# Patient Record
Sex: Female | Born: 1960 | Race: White | Hispanic: No | Marital: Single | Smoking: Never smoker
Health system: Southern US, Community
[De-identification: ages and names within clinical notes are randomized; demographics above are authoritative.]

---

## 1998-06-27 ENCOUNTER — Other Ambulatory Visit: Admission: RE | Admit: 1998-06-27 | Discharge: 1998-06-27 | Payer: Self-pay | Admitting: Obstetrics & Gynecology

## 1999-09-21 ENCOUNTER — Other Ambulatory Visit: Admission: RE | Admit: 1999-09-21 | Discharge: 1999-09-21 | Payer: Self-pay | Admitting: Obstetrics & Gynecology

## 2003-01-29 ENCOUNTER — Other Ambulatory Visit: Admission: RE | Admit: 2003-01-29 | Discharge: 2003-01-29 | Payer: Self-pay | Admitting: Obstetrics & Gynecology

## 2003-04-21 ENCOUNTER — Other Ambulatory Visit: Admission: RE | Admit: 2003-04-21 | Discharge: 2003-04-21 | Payer: Self-pay | Admitting: Obstetrics & Gynecology

## 2003-08-18 ENCOUNTER — Other Ambulatory Visit: Admission: RE | Admit: 2003-08-18 | Discharge: 2003-08-18 | Payer: Self-pay | Admitting: Obstetrics & Gynecology

## 2004-03-15 ENCOUNTER — Other Ambulatory Visit: Admission: RE | Admit: 2004-03-15 | Discharge: 2004-03-15 | Payer: Self-pay | Admitting: Obstetrics & Gynecology

## 2005-03-28 ENCOUNTER — Other Ambulatory Visit: Admission: RE | Admit: 2005-03-28 | Discharge: 2005-03-28 | Payer: Self-pay | Admitting: Obstetrics & Gynecology

## 2015-08-25 DIAGNOSIS — H16223 Keratoconjunctivitis sicca, not specified as Sjogren's, bilateral: Secondary | ICD-10-CM | POA: Diagnosis not present

## 2015-11-03 DIAGNOSIS — H16223 Keratoconjunctivitis sicca, not specified as Sjogren's, bilateral: Secondary | ICD-10-CM | POA: Diagnosis not present

## 2015-12-22 ENCOUNTER — Ambulatory Visit
Admission: RE | Admit: 2015-12-22 | Discharge: 2015-12-22 | Disposition: A | Discharge status: Home / Self Care | Payer: BLUE CROSS/BLUE SHIELD | Source: Ambulatory Visit | Attending: Physician Assistant | Admitting: Physician Assistant

## 2015-12-22 ENCOUNTER — Other Ambulatory Visit: Payer: Self-pay | Admitting: Physician Assistant

## 2015-12-22 DIAGNOSIS — M25571 Pain in right ankle and joints of right foot: Principal | ICD-10-CM

## 2015-12-22 DIAGNOSIS — F419 Anxiety disorder, unspecified: Secondary | ICD-10-CM | POA: Diagnosis not present

## 2015-12-22 DIAGNOSIS — M79671 Pain in right foot: Secondary | ICD-10-CM | POA: Diagnosis not present

## 2015-12-22 DIAGNOSIS — Z1389 Encounter for screening for other disorder: Secondary | ICD-10-CM | POA: Diagnosis not present

## 2015-12-22 DIAGNOSIS — G43009 Migraine without aura, not intractable, without status migrainosus: Secondary | ICD-10-CM | POA: Diagnosis not present

## 2015-12-22 DIAGNOSIS — G8929 Other chronic pain: Secondary | ICD-10-CM

## 2015-12-22 DIAGNOSIS — E669 Obesity, unspecified: Secondary | ICD-10-CM | POA: Diagnosis not present

## 2015-12-22 DIAGNOSIS — M19071 Primary osteoarthritis, right ankle and foot: Secondary | ICD-10-CM | POA: Diagnosis not present

## 2015-12-22 DIAGNOSIS — R7303 Prediabetes: Secondary | ICD-10-CM | POA: Diagnosis not present

## 2015-12-22 DIAGNOSIS — E7800 Pure hypercholesterolemia, unspecified: Secondary | ICD-10-CM | POA: Diagnosis not present

## 2015-12-22 DIAGNOSIS — S99921A Unspecified injury of right foot, initial encounter: Secondary | ICD-10-CM | POA: Diagnosis not present

## 2015-12-22 DIAGNOSIS — R609 Edema, unspecified: Secondary | ICD-10-CM | POA: Diagnosis not present

## 2016-06-26 DIAGNOSIS — F419 Anxiety disorder, unspecified: Secondary | ICD-10-CM | POA: Diagnosis not present

## 2016-06-26 DIAGNOSIS — Z23 Encounter for immunization: Secondary | ICD-10-CM | POA: Diagnosis not present

## 2016-06-26 DIAGNOSIS — R7303 Prediabetes: Secondary | ICD-10-CM | POA: Diagnosis not present

## 2016-06-26 DIAGNOSIS — Z79899 Other long term (current) drug therapy: Secondary | ICD-10-CM | POA: Diagnosis not present

## 2016-06-26 DIAGNOSIS — E7800 Pure hypercholesterolemia, unspecified: Secondary | ICD-10-CM | POA: Diagnosis not present

## 2016-06-26 DIAGNOSIS — R609 Edema, unspecified: Secondary | ICD-10-CM | POA: Diagnosis not present

## 2016-08-20 DIAGNOSIS — Z1231 Encounter for screening mammogram for malignant neoplasm of breast: Secondary | ICD-10-CM | POA: Diagnosis not present

## 2016-08-20 DIAGNOSIS — Z01419 Encounter for gynecological examination (general) (routine) without abnormal findings: Secondary | ICD-10-CM | POA: Diagnosis not present

## 2016-08-20 DIAGNOSIS — Z124 Encounter for screening for malignant neoplasm of cervix: Secondary | ICD-10-CM | POA: Diagnosis not present

## 2016-08-20 DIAGNOSIS — Z6835 Body mass index (BMI) 35.0-35.9, adult: Secondary | ICD-10-CM | POA: Diagnosis not present

## 2016-08-24 DIAGNOSIS — J320 Chronic maxillary sinusitis: Secondary | ICD-10-CM | POA: Diagnosis not present

## 2016-09-05 DIAGNOSIS — Z1211 Encounter for screening for malignant neoplasm of colon: Secondary | ICD-10-CM | POA: Diagnosis not present

## 2016-12-14 DIAGNOSIS — J320 Chronic maxillary sinusitis: Secondary | ICD-10-CM | POA: Diagnosis not present

## 2016-12-19 DIAGNOSIS — K1379 Other lesions of oral mucosa: Secondary | ICD-10-CM | POA: Diagnosis not present

## 2016-12-31 DIAGNOSIS — F419 Anxiety disorder, unspecified: Secondary | ICD-10-CM | POA: Diagnosis not present

## 2016-12-31 DIAGNOSIS — E7800 Pure hypercholesterolemia, unspecified: Secondary | ICD-10-CM | POA: Diagnosis not present

## 2016-12-31 DIAGNOSIS — R7303 Prediabetes: Secondary | ICD-10-CM | POA: Diagnosis not present

## 2016-12-31 DIAGNOSIS — R609 Edema, unspecified: Secondary | ICD-10-CM | POA: Diagnosis not present

## 2017-02-19 DIAGNOSIS — D2239 Melanocytic nevi of other parts of face: Secondary | ICD-10-CM | POA: Diagnosis not present

## 2017-02-19 DIAGNOSIS — L814 Other melanin hyperpigmentation: Secondary | ICD-10-CM | POA: Diagnosis not present

## 2017-04-25 DIAGNOSIS — Z23 Encounter for immunization: Secondary | ICD-10-CM | POA: Diagnosis not present

## 2017-07-05 DIAGNOSIS — F419 Anxiety disorder, unspecified: Secondary | ICD-10-CM | POA: Diagnosis not present

## 2017-07-05 DIAGNOSIS — E7800 Pure hypercholesterolemia, unspecified: Secondary | ICD-10-CM | POA: Diagnosis not present

## 2017-07-05 DIAGNOSIS — Z1331 Encounter for screening for depression: Secondary | ICD-10-CM | POA: Diagnosis not present

## 2017-07-05 DIAGNOSIS — R609 Edema, unspecified: Secondary | ICD-10-CM | POA: Diagnosis not present

## 2017-07-05 DIAGNOSIS — G43009 Migraine without aura, not intractable, without status migrainosus: Secondary | ICD-10-CM | POA: Diagnosis not present

## 2017-09-10 DIAGNOSIS — Z6835 Body mass index (BMI) 35.0-35.9, adult: Secondary | ICD-10-CM | POA: Diagnosis not present

## 2017-09-10 DIAGNOSIS — Z1231 Encounter for screening mammogram for malignant neoplasm of breast: Secondary | ICD-10-CM | POA: Diagnosis not present

## 2017-09-10 DIAGNOSIS — Z01419 Encounter for gynecological examination (general) (routine) without abnormal findings: Secondary | ICD-10-CM | POA: Diagnosis not present

## 2017-10-07 DIAGNOSIS — Z1211 Encounter for screening for malignant neoplasm of colon: Secondary | ICD-10-CM | POA: Diagnosis not present

## 2017-12-13 ENCOUNTER — Encounter: Payer: Self-pay | Admitting: Sports Medicine

## 2017-12-13 ENCOUNTER — Other Ambulatory Visit: Payer: Self-pay | Admitting: Sports Medicine

## 2017-12-13 ENCOUNTER — Ambulatory Visit (INDEPENDENT_AMBULATORY_CARE_PROVIDER_SITE_OTHER): Payer: BLUE CROSS/BLUE SHIELD

## 2017-12-13 ENCOUNTER — Ambulatory Visit (INDEPENDENT_AMBULATORY_CARE_PROVIDER_SITE_OTHER): Payer: BLUE CROSS/BLUE SHIELD | Admitting: Sports Medicine

## 2017-12-13 VITALS — BP 128/80 | HR 90 | Temp 98.0°F | Resp 16

## 2017-12-13 DIAGNOSIS — M79674 Pain in right toe(s): Secondary | ICD-10-CM | POA: Diagnosis not present

## 2017-12-13 DIAGNOSIS — M2041 Other hammer toe(s) (acquired), right foot: Secondary | ICD-10-CM | POA: Diagnosis not present

## 2017-12-13 DIAGNOSIS — M6740 Ganglion, unspecified site: Secondary | ICD-10-CM

## 2017-12-13 DIAGNOSIS — G8929 Other chronic pain: Secondary | ICD-10-CM

## 2017-12-13 NOTE — Progress Notes (Signed)
   Subjective:    Patient ID: Jillian Nguyen, female    DOB: [birth year], 57 y.o.   MRN: [id number]  HPI    Review of Systems  All other systems reviewed and are negative.      Objective:   Physical Exam        Assessment & Plan:

## 2017-12-13 NOTE — Progress Notes (Signed)
Subjective: Kenn FileJudy S Slape is a 57 y.o. female patient who presents to office for evaluation of right foot pain. Patient reports that the right second toe used to be painful and sore however over the last 6 months has developed a growth at the toe that popped on Thursday and relieved her pain.  Patient reports that she has been using antibiotic cream to the right second toe and reports that the drainage from the toe was mucus in nature.  Does admit to warmth when the toe is swollen however denies nausea vomiting fever chills or any other constitutional symptoms at this time.  Patient denies a history of injury to the right second toe.  Review of Systems  All other systems reviewed and are negative.    There are no active problems to display for this patient.   No current outpatient medications on file prior to visit.   No current facility-administered medications on file prior to visit.     Not on File  Objective:  General: Alert and oriented x3 in no acute distress  Dermatology: Focal small raised soft tissue  mass at the distal interphalangeal joint of the right second toe consistent with cyst, no open lesions bilateral lower extremities, no webspace macerations, no ecchymosis bilateral, all nails x 10 are well manicured.  Vascular: Dorsalis Pedis and Posterior Tibial pedal pulses palpable, Capillary Fill Time 3 seconds,(+) pedal hair growth bilateral, no edema bilateral lower extremities, Temperature gradient within normal limits.  Neurology: Michaell CowingGross sensation intact via light touch bilateral, Protective sensation intact  with Phoebe PerchSemmes Weinstein Monofilament to all pedal sites, Position sense intact, vibratory intact bilateral, Deep tendon reflexes within normal limits bilateral, No babinski sign present bilateral. (- )Tinels sign bilateral.   Musculoskeletal: No current tenderness to palpation at the right second toe at area of concern, mild hammertoe deformity, hallux limitus noted and  pes planus deformity noted, strength within normal limits in all groups bilateral.   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  Assessment and Plan: Problem List Items Addressed This Visit    None    Visit Diagnoses    Mucoid cyst of joint    -  Primary   Relevant Orders   DG Foot Complete Right   Hammertoe of second toe of right foot       Toe pain, right           -Complete examination performed -Xrays reviewed -Discussed treatement options for mucoid cyst -Advised patient that if area becomes painful again to return to office for steroid injection versus aspiration versus surgery planning meanwhile advised patient to continue with protection to the toe and antibiotic cream as needed if there is any redness or drainage -Patient to return to office as needed or sooner if condition worsens.  Asencion Islamitorya Augustino Savastano, DPM

## 2018-01-03 DIAGNOSIS — E7800 Pure hypercholesterolemia, unspecified: Secondary | ICD-10-CM | POA: Diagnosis not present

## 2018-01-03 DIAGNOSIS — F419 Anxiety disorder, unspecified: Secondary | ICD-10-CM | POA: Diagnosis not present

## 2018-01-03 DIAGNOSIS — G43009 Migraine without aura, not intractable, without status migrainosus: Secondary | ICD-10-CM | POA: Diagnosis not present

## 2018-01-03 DIAGNOSIS — R609 Edema, unspecified: Secondary | ICD-10-CM | POA: Diagnosis not present

## 2018-02-27 ENCOUNTER — Telehealth: Payer: Self-pay | Admitting: *Deleted

## 2018-02-27 NOTE — Telephone Encounter (Signed)
"  I got some questions about a surgery that's coming up.  I'm trying to beat the insurance.  My insurance changes December 1."

## 2018-02-28 NOTE — Telephone Encounter (Signed)
"  I called yesterday about needing some information about the surgery."

## 2018-03-06 ENCOUNTER — Ambulatory Visit: Payer: BLUE CROSS/BLUE SHIELD | Admitting: Sports Medicine

## 2018-03-06 ENCOUNTER — Encounter: Payer: Self-pay | Admitting: Sports Medicine

## 2018-03-06 ENCOUNTER — Encounter

## 2018-03-06 DIAGNOSIS — M2041 Other hammer toe(s) (acquired), right foot: Secondary | ICD-10-CM

## 2018-03-06 DIAGNOSIS — M6740 Ganglion, unspecified site: Secondary | ICD-10-CM | POA: Diagnosis not present

## 2018-03-06 DIAGNOSIS — M79674 Pain in right toe(s): Secondary | ICD-10-CM

## 2018-03-06 NOTE — Patient Instructions (Signed)
.tfcPre-Operative Instructions  Congratulations, you have decided to take an important step towards improving your quality of life.  You can be assured that the doctors and staff at [facility] will be with you every step of the way.  Here are some important things you should know:  1. Plan to be at the surgery center/hospital at least 1 (one) hour prior to your scheduled time, unless otherwise directed by the surgical center/hospital staff.  You must have a responsible adult accompany you, remain during the surgery and drive you home.  Make sure you have directions to the surgical center/hospital to ensure you arrive on time. 2. If you are having surgery at [facility] or [facility], you will need a copy of your medical history and physical form from your family physician within one month prior to the date of surgery. We will give you a form for your primary physician to complete.  3. We make every effort to accommodate the date you request for surgery.  However, there are times where surgery dates or times have to be moved.  We will contact you as soon as possible if a change in schedule is required.   4. No aspirin/ibuprofen for one week before surgery.  If you are on aspirin, any non-steroidal anti-inflammatory medications (Mobic, Aleve, Ibuprofen) should not be taken seven (7) days prior to your surgery.  You make take Tylenol for pain prior to surgery.  5. Medications - If you are taking daily heart and blood pressure medications, seizure, reflux, allergy, asthma, anxiety, pain or diabetes medications, make sure you notify the surgery center/hospital before the day of surgery so they can tell you which medications you should take or avoid the day of surgery. 6. No food or drink after midnight the night before surgery unless directed otherwise by surgical center/hospital staff. 7. No alcoholic beverages [id number] prior to surgery.  No smoking 24-hours prior or 24-hours after  surgery. 8. Wear loose pants or shorts. They should be loose enough to fit over bandages, boots, and casts. 9. Don't wear slip-on shoes. Sneakers are preferred. 10. Bring your boot with you to the surgery center/hospital.  Also bring crutches or a walker if your physician has prescribed it for you.  If you do not have this equipment, it will be provided for you after surgery. 11. If you have not been contacted by the surgery center/hospital by the day before your surgery, call to confirm the date and time of your surgery. 12. Leave-time from work may vary depending on the type of surgery you have.  Appropriate arrangements should be made prior to surgery with your employer. 13. Prescriptions will be provided immediately following surgery by your doctor.  Fill these as soon as possible after surgery and take the medication as directed. Pain medications will not be refilled on weekends and must be approved by the doctor. 14. Remove nail polish on the operative foot and avoid getting pedicures prior to surgery. 15. Wash the night before surgery.  The night before surgery wash the foot and leg well with water and the antibacterial soap provided. Be sure to pay special attention to beneath the toenails and in between the toes.  Wash for at least three (3) minutes. Rinse thoroughly with water and dry well with a towel.  Perform this wash unless told not to do so by your physician.  Enclosed: 1 Ice pack (please put in freezer the night before surgery)   1 Hibiclens skin cleaner  Pre-op instructions  If you have any questions regarding the instructions, please do not hesitate to call our office.  [place]: [address], [place], [place] [address] -- [phone]  [place]: [address] [place], [place] [address] -- [phone]  [place]: [address]  [place], [place] [address] -- [phone]  [place]: [address], [address], [place], [place] [address] -- [phone]  Website:  [email or web address] 

## 2018-03-06 NOTE — Progress Notes (Signed)
Subjective: Jillian Nguyen is a 57 y.o. female patient who returns office for evaluation of right second toe cyst and for discussion of possible surgical intervention.  Patient reports that since her last visit the cyst has filled up again and she is becoming concerned because she is afraid of infection since she has to keep popping it.  Patient also reports that her insurance will be changing and wants to have this evaluated before the insurance does change.  Patient states that she wants to schedule surgery before November 1.  Patient denies nausea vomiting fever chills any significant warmth or redness of the last 3 days to the right second toe.  No other issues noted.   There are no active problems to display for this patient.   Current Outpatient Medications on File Prior to Visit  Medication Sig Dispense Refill  . cycloSPORINE (RESTASIS) 0.05 % ophthalmic emulsion 1 drop 2 (two) times daily.    . hydrochlorothiazide (HYDRODIURIL) 25 MG tablet Take 25 mg by mouth daily.    . hydrOXYzine (ATARAX/VISTARIL) 25 MG tablet Take 25 mg by mouth 3 (three) times daily as needed.    . Influenza Vac Subunit Quad (FLUCELVAX QUADRIVALENT) SUSP Flucelvax Quad 2018-2019 60 mcg (15 mcg x 4)/0.5 mL IM suspension  ADM 0.5ML IM UTD    . Misc Natural Products (OSTEO BI-FLEX/5-LOXIN ADVANCED) TABS Take by mouth.    . Omega-3 Fatty Acids (FISH OIL) 1000 MG CAPS Take by mouth.    . SUMAtriptan (IMITREX) 100 MG tablet sumatriptan 100 mg tablet    . traMADol (ULTRAM) 50 MG tablet tramadol 50 mg tablet  TAKE 1 TABLET BY MOUTH EVERY 4 TO 6 HOURS AS NEEDED FOR PAIN    . triamcinolone lotion (KENALOG) 0.1 % triamcinolone acetonide 0.1 % lotion     No current facility-administered medications on file prior to visit.     Not on File  Objective:  General: Alert and oriented x3 in no acute distress  Dermatology: Focal small raised soft tissue  mass at the distal interphalangeal joint of the right second toe  consistent with cyst, no open lesions bilateral lower extremities, no webspace macerations, no ecchymosis bilateral, all nails x 10 are well manicured.  Vascular: Dorsalis Pedis and Posterior Tibial pedal pulses palpable, Capillary Fill Time 3 seconds,(+) pedal hair growth bilateral, no edema bilateral lower extremities, Temperature gradient within normal limits.  Neurology: Gross sensation intact via light touch bilateral.  Musculoskeletal: Mild tenderness to palpation at the right second toe at area of concern, mild hammertoe deformity, hallux limitus noted and pes planus deformity noted, strength within normal limits in all groups bilateral.    Assessment and Plan: Problem List Items Addressed This Visit    None    Visit Diagnoses    Mucoid cyst of joint    -  Primary   Hammertoe of second toe of right foot       Toe pain, right           -Complete examination performed -Previous xrays reviewed -Discussed treatement options for mucoid cyst with hammertoe deformity -Patient opt for surgical management. Consent obtained for right second hammertoe repair with excision of mucoid cyst and stabilization with K wire. Pre and Post op course explained. Risks, benefits, alternatives explained. No guarantees given or implied. Surgical booking slip submitted and provided patient with Surgical packet and info for [facility].  Patient to contact surgery scheduler for date. -To dispense CAM Walker to use postop for 4 weeks and  advised patient to refrain from work for at least that timeframe to allow sufficient healing -Patient to return to office after surgery or sooner if condition worsens.  Asencion Islam, DPM

## 2018-03-07 NOTE — Telephone Encounter (Signed)
I am returning your call.  You want to schedule your surgery with Dr. Marylene Land?  "Yes, I do."  She does surgery on Mondays. Do you have a date in mind?  She can do it on October 28.  "I don't want to do it that soon.  Can I do it on November 4?"  November 4 is available.  I will get it scheduled.  All you need to do is register with the surgical center, instructions are in the brochure that I they gave you.  "What's the earliest I can have it done?"  I can't give you an arrival time.  Someone from the surgical center will give you a call a day or two prior to your surgery date.  They will give you your arrival time.

## 2018-03-10 ENCOUNTER — Telehealth: Payer: Self-pay | Admitting: *Deleted

## 2018-03-10 NOTE — Telephone Encounter (Signed)
"  Dr. Marylene Land told me to call this morning and I got your message."

## 2018-03-19 ENCOUNTER — Ambulatory Visit: Payer: BLUE CROSS/BLUE SHIELD | Admitting: Sports Medicine

## 2018-03-20 ENCOUNTER — Encounter: Payer: Self-pay | Admitting: *Deleted

## 2018-03-24 ENCOUNTER — Encounter: Payer: Self-pay | Admitting: Sports Medicine

## 2018-03-24 ENCOUNTER — Other Ambulatory Visit: Payer: Self-pay | Admitting: Sports Medicine

## 2018-03-24 ENCOUNTER — Encounter: Payer: Self-pay | Admitting: Podiatry

## 2018-03-24 DIAGNOSIS — M2041 Other hammer toe(s) (acquired), right foot: Secondary | ICD-10-CM | POA: Diagnosis not present

## 2018-03-24 DIAGNOSIS — L720 Epidermal cyst: Secondary | ICD-10-CM | POA: Diagnosis not present

## 2018-03-24 DIAGNOSIS — G43909 Migraine, unspecified, not intractable, without status migrainosus: Secondary | ICD-10-CM | POA: Diagnosis not present

## 2018-03-24 DIAGNOSIS — M6798 Unspecified disorder of synovium and tendon, other site: Secondary | ICD-10-CM | POA: Diagnosis not present

## 2018-03-24 DIAGNOSIS — M67471 Ganglion, right ankle and foot: Secondary | ICD-10-CM | POA: Diagnosis not present

## 2018-03-24 MED ORDER — DOCUSATE SODIUM 100 MG PO CAPS: 100.0000 mg | ORAL_CAPSULE | Freq: Two times a day (BID) | ORAL | 0 refills | Status: AC

## 2018-03-24 MED ORDER — HYDROCODONE-ACETAMINOPHEN 10-325 MG PO TABS: 1.0000 | ORAL_TABLET | Freq: Four times a day (QID) | ORAL | 0 refills | Status: AC | PRN

## 2018-03-24 MED ORDER — PROMETHAZINE HCL 25 MG PO TABS: 25.0000 mg | ORAL_TABLET | Freq: Three times a day (TID) | ORAL | 0 refills | Status: AC | PRN

## 2018-03-24 NOTE — Progress Notes (Signed)
Post op meds added to chart -Dr. Marylene Land

## 2018-03-25 ENCOUNTER — Telehealth: Payer: Self-pay | Admitting: Sports Medicine

## 2018-03-25 NOTE — Telephone Encounter (Signed)
Post op check phone call made to patient. Patient did not answer. Left voicemail for patient to call office if there are any post op concerns or issues. -Dr. Marylene Land

## 2018-03-27 ENCOUNTER — Encounter: Payer: Self-pay | Admitting: Sports Medicine

## 2018-04-02 ENCOUNTER — Ambulatory Visit (INDEPENDENT_AMBULATORY_CARE_PROVIDER_SITE_OTHER): Payer: BLUE CROSS/BLUE SHIELD

## 2018-04-02 ENCOUNTER — Ambulatory Visit (INDEPENDENT_AMBULATORY_CARE_PROVIDER_SITE_OTHER): Payer: BLUE CROSS/BLUE SHIELD | Admitting: Sports Medicine

## 2018-04-02 ENCOUNTER — Encounter: Payer: Self-pay | Admitting: Sports Medicine

## 2018-04-02 VITALS — BP 132/77 | HR 104 | Temp 97.3°F | Resp 16

## 2018-04-02 DIAGNOSIS — M2041 Other hammer toe(s) (acquired), right foot: Secondary | ICD-10-CM | POA: Diagnosis not present

## 2018-04-02 DIAGNOSIS — M6740 Ganglion, unspecified site: Secondary | ICD-10-CM

## 2018-04-02 DIAGNOSIS — M79674 Pain in right toe(s): Secondary | ICD-10-CM

## 2018-04-02 DIAGNOSIS — Z9889 Other specified postprocedural states: Secondary | ICD-10-CM

## 2018-04-02 NOTE — Progress Notes (Signed)
Subjective: Jillian Nguyen is a 57 y.o. female patient seen today in office for POV #1 (DOS 03/24/2018), S/P excision of ganglion cyst and right second toe hammertoe repair. Patient denies pain at surgical site currently but did state that she had one episode of burning early Sunday morning that went away on its own otherwise she has been doing great, denies calf pain, denies headache, chest pain, shortness of breath, nausea, vomiting, fever, or chills. Patient states that she is doing well and is only taking Ibuprofen or Tylenol as needed and only took 3 tablets of the Vicodin pain medication. No other issues noted.   There are no active problems to display for this patient.   Current Outpatient Medications on File Prior to Visit  Medication Sig Dispense Refill  . cycloSPORINE (RESTASIS) 0.05 % ophthalmic emulsion 1 drop 2 (two) times daily.    Marland Kitchen docusate sodium (COLACE) 100 MG capsule Take 1 capsule (100 mg total) by mouth 2 (two) times daily. 30 capsule 0  . hydrochlorothiazide (HYDRODIURIL) 25 MG tablet Take 25 mg by mouth daily.    . hydrOXYzine (ATARAX/VISTARIL) 25 MG tablet Take 25 mg by mouth 3 (three) times daily as needed.    . Influenza Vac Subunit Quad (FLUCELVAX QUADRIVALENT) SUSP Flucelvax Quad 2018-2019 60 mcg (15 mcg x 4)/0.5 mL IM suspension  ADM 0.5ML IM UTD    . Misc Natural Products (OSTEO BI-FLEX/5-LOXIN ADVANCED) TABS Take by mouth.    . Omega-3 Fatty Acids (FISH OIL) 1000 MG CAPS Take by mouth.    . promethazine (PHENERGAN) 25 MG tablet Take 1 tablet (25 mg total) by mouth every 8 (eight) hours as needed for nausea or vomiting. 30 tablet 0  . SUMAtriptan (IMITREX) 100 MG tablet sumatriptan 100 mg tablet    . traMADol (ULTRAM) 50 MG tablet tramadol 50 mg tablet  TAKE 1 TABLET BY MOUTH EVERY 4 TO 6 HOURS AS NEEDED FOR PAIN    . triamcinolone lotion (KENALOG) 0.1 % triamcinolone acetonide 0.1 % lotion     No current facility-administered medications on file prior to visit.      Not on File  Objective: There were no vitals filed for this visit.  General: No acute distress, AAOx3  Right face foot: Sutures and K wire intact with no gapping or dehiscence at surgical site, mild swelling to right foot second toe, mild bruising at lesser toes, no erythema, no warmth, no drainage, no signs of infection noted, Capillary fill time <3 seconds in all digits, gross sensation present via light touch to right foot.  No pain or crepitation with range of motion right foot.  No pain with calf compression.   Post Op Xray, right foot: K wire and second toe and excellent alignment and position. Osteotomy/arthrodesis site healing. Hardware intact. Soft tissue swelling within normal limits for post op status.   Assessment and Plan:  Problem List Items Addressed This Visit    None    Visit Diagnoses    Mucoid cyst of joint    -  Primary   Relevant Orders   DG Foot Complete Right   Hammertoe of second toe of right foot       Relevant Orders   DG Foot Complete Right   Toe pain, right       Postoperative state           -Patient seen and evaluated -X-rays reviewed -Applied dry sterile dressing to surgical site right foot secured with ACE wrap and stockinet  -  Advised patient to make sure to keep dressings clean, dry, and intact to right surgical site, removing the ACE as needed  -Advised patient to continue with cam boot on the right foot -Advised patient to limit activity to necessity  -Advised patient to ice and elevate as necessary -Continue with no work expect possible return to work on April 23, 2018 however did tell patient this date is tentative depending on how she is doing at follow-up postoperative visit. -Will plan for possible suture removal at next office visit. In the meantime, patient to call office if any issues or problems arise.   Asencion Islamitorya Raeford Brandenburg, DPM

## 2018-04-07 ENCOUNTER — Other Ambulatory Visit: Payer: Self-pay | Admitting: Sports Medicine

## 2018-04-07 DIAGNOSIS — Z9889 Other specified postprocedural states: Secondary | ICD-10-CM

## 2018-04-07 DIAGNOSIS — M2041 Other hammer toe(s) (acquired), right foot: Secondary | ICD-10-CM

## 2018-04-07 DIAGNOSIS — M6740 Ganglion, unspecified site: Secondary | ICD-10-CM

## 2018-04-09 ENCOUNTER — Ambulatory Visit (INDEPENDENT_AMBULATORY_CARE_PROVIDER_SITE_OTHER): Payer: BLUE CROSS/BLUE SHIELD | Admitting: Sports Medicine

## 2018-04-09 ENCOUNTER — Encounter: Payer: Self-pay | Admitting: *Deleted

## 2018-04-09 ENCOUNTER — Encounter: Payer: Self-pay | Admitting: Sports Medicine

## 2018-04-09 VITALS — BP 125/77 | HR 84 | Temp 98.2°F | Resp 16

## 2018-04-09 DIAGNOSIS — M79674 Pain in right toe(s): Secondary | ICD-10-CM

## 2018-04-09 DIAGNOSIS — M6740 Ganglion, unspecified site: Secondary | ICD-10-CM

## 2018-04-09 DIAGNOSIS — M2041 Other hammer toe(s) (acquired), right foot: Secondary | ICD-10-CM

## 2018-04-09 DIAGNOSIS — Z9889 Other specified postprocedural states: Secondary | ICD-10-CM

## 2018-04-09 NOTE — Progress Notes (Signed)
Subjective: Jillian Nguyen is a 57 y.o. female patient seen today in office for POV #2 (DOS 03/24/2018), S/P excision of ganglion cyst and right second toe hammertoe repair. Patient admits increased pain since Monday states that over the weekend she went to the antique showing getting up walking and standing with her boot states that her pain on Monday and Tuesday with 8 out of 10 but today she is doing fine with very little pain however she did take some Motrin before coming to office visit in case we had to remove first sutures.  Patient denies calf pain, denies headache, chest pain, shortness of breath, nausea, vomiting, fever, or chills. No other issues noted.   There are no active problems to display for this patient.   Current Outpatient Medications on File Prior to Visit  Medication Sig Dispense Refill  . cycloSPORINE (RESTASIS) 0.05 % ophthalmic emulsion 1 drop 2 (two) times daily.    Marland Kitchen docusate sodium (COLACE) 100 MG capsule Take 1 capsule (100 mg total) by mouth 2 (two) times daily. 30 capsule 0  . hydrochlorothiazide (HYDRODIURIL) 25 MG tablet Take 25 mg by mouth daily.    . hydrOXYzine (ATARAX/VISTARIL) 25 MG tablet Take 25 mg by mouth 3 (three) times daily as needed.    . Influenza Vac Subunit Quad (FLUCELVAX QUADRIVALENT) SUSP Flucelvax Quad 2018-2019 60 mcg (15 mcg x 4)/0.5 mL IM suspension  ADM 0.5ML IM UTD    . Misc Natural Products (OSTEO BI-FLEX/5-LOXIN ADVANCED) TABS Take by mouth.    . Omega-3 Fatty Acids (FISH OIL) 1000 MG CAPS Take by mouth.    . promethazine (PHENERGAN) 25 MG tablet Take 1 tablet (25 mg total) by mouth every 8 (eight) hours as needed for nausea or vomiting. 30 tablet 0  . SUMAtriptan (IMITREX) 100 MG tablet sumatriptan 100 mg tablet    . traMADol (ULTRAM) 50 MG tablet tramadol 50 mg tablet  TAKE 1 TABLET BY MOUTH EVERY 4 TO 6 HOURS AS NEEDED FOR PAIN    . triamcinolone lotion (KENALOG) 0.1 % triamcinolone acetonide 0.1 % lotion     No current  facility-administered medications on file prior to visit.     Not on File  Objective: There were no vitals filed for this visit.  General: No acute distress, AAOx3  Right foot: Sutures and K wire intact with no gapping or dehiscence at surgical site, mild swelling to right foot second toe, mild bruising at lesser toes, no erythema, no warmth, no drainage, no signs of infection noted, Capillary fill time <3 seconds in all digits, gross sensation present via light touch to right foot.  No pain or crepitation with range of motion right foot.  No pain with calf compression.   Assessment and Plan:  Problem List Items Addressed This Visit    None    Visit Diagnoses    Mucoid cyst of joint    -  Primary   Hammertoe of second toe of right foot       Toe pain, right       Postoperative state           -Patient seen and evaluated -Sutures removed -Applied dry sterile dressing to surgical site right foot secured with ACE wrap and stockinet  -Advised patient to make sure to keep dressings clean, dry, and intact to right surgical site, removing the ACE as needed  -Advised patient to continue with cam boot on the right foot -Advised patient to limit activity to necessity  -  Advised patient to ice and elevate as necessary -Continue with no work expect possible return to work on May 05, 2018  -Will plan for x-ray and possible K wire removal at next office visit. In the meantime, patient to call office if any issues or problems arise.   Asencion Islamitorya Miquan Tandon, DPM

## 2018-04-16 ENCOUNTER — Encounter: Payer: Self-pay | Admitting: Sports Medicine

## 2018-04-16 ENCOUNTER — Ambulatory Visit (INDEPENDENT_AMBULATORY_CARE_PROVIDER_SITE_OTHER): Payer: BLUE CROSS/BLUE SHIELD

## 2018-04-16 ENCOUNTER — Ambulatory Visit (INDEPENDENT_AMBULATORY_CARE_PROVIDER_SITE_OTHER): Payer: BLUE CROSS/BLUE SHIELD | Admitting: Sports Medicine

## 2018-04-16 ENCOUNTER — Ambulatory Visit: Payer: BLUE CROSS/BLUE SHIELD | Admitting: Sports Medicine

## 2018-04-16 VITALS — BP 130/86 | HR 73 | Temp 98.0°F | Resp 16

## 2018-04-16 DIAGNOSIS — M6740 Ganglion, unspecified site: Secondary | ICD-10-CM | POA: Diagnosis not present

## 2018-04-16 DIAGNOSIS — M2041 Other hammer toe(s) (acquired), right foot: Secondary | ICD-10-CM

## 2018-04-16 DIAGNOSIS — M79674 Pain in right toe(s): Secondary | ICD-10-CM | POA: Diagnosis not present

## 2018-04-16 DIAGNOSIS — Z9889 Other specified postprocedural states: Secondary | ICD-10-CM

## 2018-04-16 NOTE — Progress Notes (Signed)
Subjective: Jillian Nguyen is a 57 y.o. female patient seen today in office for POV #3 (DOS 03/24/2018), S/P excision of ganglion cyst and right second toe hammertoe repair. Patient reports that she has no pain at all and that her toe is feeling good especially since she keeps the end of her toe protected and make sure she does not K wire that was present.  Patient denies nausea vomiting fever chills or any other constitutional symptoms at this time.  There are no active problems to display for this patient.   Current Outpatient Medications on File Prior to Visit  Medication Sig Dispense Refill  . cycloSPORINE (RESTASIS) 0.05 % ophthalmic emulsion 1 drop 2 (two) times daily.    Marland Kitchen. docusate sodium (COLACE) 100 MG capsule Take 1 capsule (100 mg total) by mouth 2 (two) times daily. 30 capsule 0  . hydrochlorothiazide (HYDRODIURIL) 25 MG tablet Take 25 mg by mouth daily.    . hydrOXYzine (ATARAX/VISTARIL) 25 MG tablet Take 25 mg by mouth 3 (three) times daily as needed.    . Influenza Vac Subunit Quad (FLUCELVAX QUADRIVALENT) SUSP Flucelvax Quad 2018-2019 60 mcg (15 mcg x 4)/0.5 mL IM suspension  ADM 0.5ML IM UTD    . Misc Natural Products (OSTEO BI-FLEX/5-LOXIN ADVANCED) TABS Take by mouth.    . Omega-3 Fatty Acids (FISH OIL) 1000 MG CAPS Take by mouth.    . promethazine (PHENERGAN) 25 MG tablet Take 1 tablet (25 mg total) by mouth every 8 (eight) hours as needed for nausea or vomiting. 30 tablet 0  . SUMAtriptan (IMITREX) 100 MG tablet sumatriptan 100 mg tablet    . traMADol (ULTRAM) 50 MG tablet tramadol 50 mg tablet  TAKE 1 TABLET BY MOUTH EVERY 4 TO 6 HOURS AS NEEDED FOR PAIN    . triamcinolone lotion (KENALOG) 0.1 % triamcinolone acetonide 0.1 % lotion     No current facility-administered medications on file prior to visit.     Not on File  Objective: There were no vitals filed for this visit.  General: No acute distress, AAOx3  Right foot:K wire intact with no gapping or dehiscence at  surgical site, mild swelling to right foot second toe, resolved bruising at lesser toes, no erythema, no warmth, no drainage, no signs of infection noted, Capillary fill time <3 seconds in all digits, gross sensation present via light touch to right foot.  No pain or crepitation with range of motion right foot.  No pain with calf compression.   X-rays K wire intact the second toe with some osseous fusion and appears that the K wire can be removed at this point  Assessment and Plan:  Problem List Items Addressed This Visit    None    Visit Diagnoses    Mucoid cyst of joint    -  Primary   Hammertoe of second toe of right foot       Relevant Orders   DG Foot Complete Right   Toe pain, right       Postoperative state           -Patient seen and evaluated -K wire removed -Applied Steri-Strips and advised patient that she may shower on tomorrow and allow the Steri-Strips to fall off on their own -Dispense postop shoe for patient to use however we did not have a size medium so patient will return to office for a size medium shoe and will continue with cam boot until the shoe is available for her to use -  Advised patient to ice and elevate as necessary -Continue with no work expect possible return to work on May 05, 2018  -Will plan for toe check and transitioning patient from postop shoe to tennis shoe and returning to work at next office visit. In the meantime, patient to call office if any issues or problems arise.   Asencion Islam, DPM

## 2018-04-21 ENCOUNTER — Other Ambulatory Visit: Payer: Self-pay | Admitting: Sports Medicine

## 2018-04-21 DIAGNOSIS — Z9889 Other specified postprocedural states: Secondary | ICD-10-CM

## 2018-04-21 DIAGNOSIS — M6740 Ganglion, unspecified site: Secondary | ICD-10-CM

## 2018-04-21 DIAGNOSIS — M2041 Other hammer toe(s) (acquired), right foot: Secondary | ICD-10-CM

## 2018-05-02 ENCOUNTER — Encounter: Payer: Self-pay | Admitting: *Deleted

## 2018-05-02 ENCOUNTER — Ambulatory Visit (INDEPENDENT_AMBULATORY_CARE_PROVIDER_SITE_OTHER): Payer: Self-pay

## 2018-05-02 ENCOUNTER — Encounter: Payer: Self-pay | Admitting: Sports Medicine

## 2018-05-02 ENCOUNTER — Ambulatory Visit (INDEPENDENT_AMBULATORY_CARE_PROVIDER_SITE_OTHER): Payer: Self-pay | Admitting: Sports Medicine

## 2018-05-02 VITALS — BP 148/90 | HR 81 | Temp 98.9°F | Resp 16

## 2018-05-02 DIAGNOSIS — M79674 Pain in right toe(s): Secondary | ICD-10-CM

## 2018-05-02 DIAGNOSIS — M6740 Ganglion, unspecified site: Secondary | ICD-10-CM

## 2018-05-02 DIAGNOSIS — M2041 Other hammer toe(s) (acquired), right foot: Secondary | ICD-10-CM

## 2018-05-02 DIAGNOSIS — Z9889 Other specified postprocedural states: Secondary | ICD-10-CM

## 2018-05-02 NOTE — Progress Notes (Signed)
Subjective: Jillian Nguyen is a 57 y.o. female patient seen today in office for POV #4 (DOS 03/24/2018), S/P excision of ganglion cyst and right second toe hammertoe repair. Patient reports that she has no pain now but on Monday dropped a charger cord and hit her right second toe and experienced sharp pain 10 out of 10.  Patient reports that sometimes her incision is sensitive and she cannot stand her sock rubbing it.  Reports that she was not ever called to get her postop shoe but has slowly transition to slipper without issues.  Patient denies nausea vomiting fever chills or any other constitutional symptoms at this time.  There are no active problems to display for this patient.   Current Outpatient Medications on File Prior to Visit  Medication Sig Dispense Refill  . cycloSPORINE (RESTASIS) 0.05 % ophthalmic emulsion 1 drop 2 (two) times daily.    Marland Kitchen. docusate sodium (COLACE) 100 MG capsule Take 1 capsule (100 mg total) by mouth 2 (two) times daily. 30 capsule 0  . hydrochlorothiazide (HYDRODIURIL) 25 MG tablet Take 25 mg by mouth daily.    . hydrOXYzine (ATARAX/VISTARIL) 25 MG tablet Take 25 mg by mouth 3 (three) times daily as needed.    . Influenza Vac Subunit Quad (FLUCELVAX QUADRIVALENT) SUSP Flucelvax Quad 2018-2019 60 mcg (15 mcg x 4)/0.5 mL IM suspension  ADM 0.5ML IM UTD    . Misc Natural Products (OSTEO BI-FLEX/5-LOXIN ADVANCED) TABS Take by mouth.    . Omega-3 Fatty Acids (FISH OIL) 1000 MG CAPS Take by mouth.    . promethazine (PHENERGAN) 25 MG tablet Take 1 tablet (25 mg total) by mouth every 8 (eight) hours as needed for nausea or vomiting. 30 tablet 0  . SUMAtriptan (IMITREX) 100 MG tablet sumatriptan 100 mg tablet    . traMADol (ULTRAM) 50 MG tablet tramadol 50 mg tablet  TAKE 1 TABLET BY MOUTH EVERY 4 TO 6 HOURS AS NEEDED FOR PAIN    . triamcinolone lotion (KENALOG) 0.1 % triamcinolone acetonide 0.1 % lotion     No current facility-administered medications on file prior to  visit.     Not on File  Objective: There were no vitals filed for this visit.  General: No acute distress, AAOx3  Right foot: Incision well-healed with mild scab at the distal aspect, mild swelling to right foot second toe,  no erythema, no warmth, no drainage, no signs of infection noted, Capillary fill time <3 seconds in all digits, gross sensation present via light touch to right foot.  No pain or crepitation with range of motion right foot.  No pain with calf compression.   X-rays surgical site consistent with arthroplasty procedure with mild soft tissue swelling but no acute findings at second toe  Assessment and Plan:  Problem List Items Addressed This Visit    None    Visit Diagnoses    Hammertoe of second toe of right foot    -  Primary   Relevant Orders   DG Foot Complete Right   Postoperative state       Relevant Orders   DG Foot Complete Right   Mucoid cyst of joint       Toe pain, right           -Patient seen and evaluated -X-rays reviewed -Encouraged gentle massage of scar and splinting of toe as needed at bedtime to assist with keeping swelling down -Advised patient to ice and elevate as necessary -Return to work on Monday  with restrictions max 8 hours for 2 weeks -Continue with good supportive shoes as tolerated -Will plan for final toe check at next office visit. In the meantime, patient to call office if any issues or problems arise.   Asencion Islam, DPM

## 2018-05-05 NOTE — Progress Notes (Signed)
DOS  03/24/2018  Excision of cyst on toe with fusion of joints at right second toe.

## 2018-05-09 ENCOUNTER — Other Ambulatory Visit: Payer: Self-pay | Admitting: Sports Medicine

## 2018-05-09 DIAGNOSIS — M2041 Other hammer toe(s) (acquired), right foot: Secondary | ICD-10-CM

## 2018-05-09 DIAGNOSIS — Z9889 Other specified postprocedural states: Secondary | ICD-10-CM

## 2018-05-09 DIAGNOSIS — M6740 Ganglion, unspecified site: Secondary | ICD-10-CM

## 2018-06-06 ENCOUNTER — Encounter: Payer: Self-pay | Admitting: *Deleted

## 2018-06-06 ENCOUNTER — Encounter: Payer: Self-pay | Admitting: Sports Medicine

## 2018-06-06 ENCOUNTER — Ambulatory Visit (INDEPENDENT_AMBULATORY_CARE_PROVIDER_SITE_OTHER): Payer: 59 | Admitting: Sports Medicine

## 2018-06-06 VITALS — BP 116/69 | HR 84 | Temp 97.7°F | Resp 16

## 2018-06-06 DIAGNOSIS — M2041 Other hammer toe(s) (acquired), right foot: Secondary | ICD-10-CM

## 2018-06-06 DIAGNOSIS — M6740 Ganglion, unspecified site: Secondary | ICD-10-CM

## 2018-06-06 DIAGNOSIS — M79674 Pain in right toe(s): Secondary | ICD-10-CM

## 2018-06-06 DIAGNOSIS — Z9889 Other specified postprocedural states: Secondary | ICD-10-CM

## 2018-06-06 NOTE — Progress Notes (Signed)
Subjective: Jillian Nguyen is a 58 y.o. female patient seen today in office for POV #5 (DOS 03/24/2018), S/P excision of ganglion cyst and right second toe hammertoe repair. Patient reports she is doing good with a little tenderness at the tip of the toe, sometimes at worst 5 out of 10, reports that she has been taping it during the day and massaging it which helps.  Denies nausea vomiting fever chills or any other constitutional symptoms at this time.  There are no active problems to display for this patient.   Current Outpatient Medications on File Prior to Visit  Medication Sig Dispense Refill  . cycloSPORINE (RESTASIS) 0.05 % ophthalmic emulsion 1 drop 2 (two) times daily.    Marland Kitchen docusate sodium (COLACE) 100 MG capsule Take 1 capsule (100 mg total) by mouth 2 (two) times daily. 30 capsule 0  . hydrochlorothiazide (HYDRODIURIL) 25 MG tablet Take 25 mg by mouth daily.    . hydrOXYzine (ATARAX/VISTARIL) 25 MG tablet Take 25 mg by mouth 3 (three) times daily as needed.    . Influenza Vac Subunit Quad (FLUCELVAX QUADRIVALENT) SUSP Flucelvax Quad 2018-2019 60 mcg (15 mcg x 4)/0.5 mL IM suspension  ADM 0.5ML IM UTD    . Misc Natural Products (OSTEO BI-FLEX/5-LOXIN ADVANCED) TABS Take by mouth.    . Omega-3 Fatty Acids (FISH OIL) 1000 MG CAPS Take by mouth.    . promethazine (PHENERGAN) 25 MG tablet Take 1 tablet (25 mg total) by mouth every 8 (eight) hours as needed for nausea or vomiting. 30 tablet 0  . SUMAtriptan (IMITREX) 100 MG tablet sumatriptan 100 mg tablet    . traMADol (ULTRAM) 50 MG tablet tramadol 50 mg tablet  TAKE 1 TABLET BY MOUTH EVERY 4 TO 6 HOURS AS NEEDED FOR PAIN    . triamcinolone lotion (KENALOG) 0.1 % triamcinolone acetonide 0.1 % lotion     No current facility-administered medications on file prior to visit.     Not on File  Objective: There were no vitals filed for this visit.  General: No acute distress, AAOx3  Right foot: Incision well-healed, mild swelling to right  foot second toe with minimal elevation,  no erythema, no warmth, no drainage, no signs of infection noted, Capillary fill time <3 seconds in all digits, gross sensation present via light touch to right foot.  No pain or crepitation with range of motion right foot.  No pain with calf compression.    Assessment and Plan:  Problem List Items Addressed This Visit    None    Visit Diagnoses    Hammertoe of second toe of right foot    -  Primary   Postoperative state       Mucoid cyst of joint       Toe pain, right          -Patient seen and evaluated -Encouraged gentle massage of scar and splinting of toe as needed at bedtime to assist with keeping swelling down and toe in good position using Covan or paper tape as provided at this visit -No work restrictions; regular duty -Encouraged normal shoes and activities to tolerance -Will plan for final toe check at next office visit. In the meantime, patient to call office if any issues or problems arise.  If there is still any elevation will add on Darco toe alignment splint.  Asencion Islam, DPM

## 2018-07-18 ENCOUNTER — Encounter: Payer: Self-pay | Admitting: *Deleted

## 2018-07-18 ENCOUNTER — Ambulatory Visit (INDEPENDENT_AMBULATORY_CARE_PROVIDER_SITE_OTHER): Payer: 59 | Admitting: Sports Medicine

## 2018-07-18 ENCOUNTER — Encounter: Payer: Self-pay | Admitting: Sports Medicine

## 2018-07-18 VITALS — BP 124/69 | HR 77 | Temp 97.6°F | Resp 16

## 2018-07-18 DIAGNOSIS — Z9889 Other specified postprocedural states: Secondary | ICD-10-CM

## 2018-07-18 DIAGNOSIS — M79674 Pain in right toe(s): Secondary | ICD-10-CM

## 2018-07-18 DIAGNOSIS — M2041 Other hammer toe(s) (acquired), right foot: Secondary | ICD-10-CM

## 2018-07-18 DIAGNOSIS — M6740 Ganglion, unspecified site: Secondary | ICD-10-CM

## 2018-07-18 NOTE — Progress Notes (Signed)
Subjective: Jillian Nguyen is a 58 y.o. female patient seen today in office for POV #6 (DOS 03/24/2018), S/P excision of ganglion cyst and right second toe hammertoe repair. Patient reports she is doing good with a little tenderness at the tingling at toe, sometimes at worst 5 out of 10, reports that she has been using vitamin E oil to the incision and normal shoes without issues.  Patient denies nausea vomiting fever chills or any other constitutional symptoms at this time.  There are no active problems to display for this patient.   Current Outpatient Medications on File Prior to Visit  Medication Sig Dispense Refill  . cycloSPORINE (RESTASIS) 0.05 % ophthalmic emulsion 1 drop 2 (two) times daily.    Marland Kitchen docusate sodium (COLACE) 100 MG capsule Take 1 capsule (100 mg total) by mouth 2 (two) times daily. 30 capsule 0  . hydrochlorothiazide (HYDRODIURIL) 25 MG tablet Take 25 mg by mouth daily.    . hydrOXYzine (ATARAX/VISTARIL) 25 MG tablet Take 25 mg by mouth 3 (three) times daily as needed.    . Influenza Vac Subunit Quad (FLUCELVAX QUADRIVALENT) SUSP Flucelvax Quad 2018-2019 60 mcg (15 mcg x 4)/0.5 mL IM suspension  ADM 0.5ML IM UTD    . Misc Natural Products (OSTEO BI-FLEX/5-LOXIN ADVANCED) TABS Take by mouth.    . Omega-3 Fatty Acids (FISH OIL) 1000 MG CAPS Take by mouth.    . promethazine (PHENERGAN) 25 MG tablet Take 1 tablet (25 mg total) by mouth every 8 (eight) hours as needed for nausea or vomiting. 30 tablet 0  . SUMAtriptan (IMITREX) 100 MG tablet sumatriptan 100 mg tablet    . traMADol (ULTRAM) 50 MG tablet tramadol 50 mg tablet  TAKE 1 TABLET BY MOUTH EVERY 4 TO 6 HOURS AS NEEDED FOR PAIN    . triamcinolone lotion (KENALOG) 0.1 % triamcinolone acetonide 0.1 % lotion     No current facility-administered medications on file prior to visit.     Not on File  Objective: There were no vitals filed for this visit.  General: No acute distress, AAOx3  Right foot: Incision well-healed,  mild swelling to right foot second toe with minimal elevation,  no erythema, no warmth, no drainage, no signs of infection noted, Capillary fill time <3 seconds in all digits, gross sensation present via light touch to right foot.  No pain or crepitation with range of motion right foot.  No pain with calf compression.    Assessment and Plan:  Problem List Items Addressed This Visit    None    Visit Diagnoses    Hammertoe of second toe of right foot    -  Primary   Postoperative state       Mucoid cyst of joint       Toe pain, right          -Patient seen and evaluated -Surgical site well healed; May go for pedicures -Continue with scar creams and gels -Continue with good supportive shoes rest ice and elevation as needed -Patient is discharged from postoperative care return as needed or sooner if problems or issues arise.  Asencion Islam, DPM

## 2019-01-12 DIAGNOSIS — G43009 Migraine without aura, not intractable, without status migrainosus: Secondary | ICD-10-CM | POA: Diagnosis not present

## 2019-01-12 DIAGNOSIS — R609 Edema, unspecified: Secondary | ICD-10-CM | POA: Diagnosis not present

## 2019-01-12 DIAGNOSIS — R7303 Prediabetes: Secondary | ICD-10-CM | POA: Diagnosis not present

## 2019-01-12 DIAGNOSIS — E7800 Pure hypercholesterolemia, unspecified: Secondary | ICD-10-CM | POA: Diagnosis not present

## 2019-01-12 DIAGNOSIS — F419 Anxiety disorder, unspecified: Secondary | ICD-10-CM | POA: Diagnosis not present

## 2019-02-19 DIAGNOSIS — Z23 Encounter for immunization: Secondary | ICD-10-CM | POA: Diagnosis not present

## 2019-02-19 DIAGNOSIS — G43009 Migraine without aura, not intractable, without status migrainosus: Secondary | ICD-10-CM | POA: Diagnosis not present

## 2019-02-19 DIAGNOSIS — Z6837 Body mass index (BMI) 37.0-37.9, adult: Secondary | ICD-10-CM | POA: Diagnosis not present

## 2020-01-22 ENCOUNTER — Other Ambulatory Visit: Payer: Self-pay | Admitting: Physician Assistant

## 2020-01-22 DIAGNOSIS — R7989 Other specified abnormal findings of blood chemistry: Secondary | ICD-10-CM

## 2020-02-03 ENCOUNTER — Ambulatory Visit
Admission: RE | Admit: 2020-02-03 | Discharge: 2020-02-03 | Disposition: A | Discharge status: Home / Self Care | Payer: BLUE CROSS/BLUE SHIELD | Source: Ambulatory Visit | Attending: Physician Assistant | Admitting: Physician Assistant

## 2020-02-03 DIAGNOSIS — R7989 Other specified abnormal findings of blood chemistry: Secondary | ICD-10-CM

## 2020-05-05 DIAGNOSIS — G5601 Carpal tunnel syndrome, right upper limb: Secondary | ICD-10-CM | POA: Diagnosis not present

## 2020-05-05 DIAGNOSIS — G5603 Carpal tunnel syndrome, bilateral upper limbs: Secondary | ICD-10-CM | POA: Diagnosis not present

## 2020-07-18 DIAGNOSIS — G5601 Carpal tunnel syndrome, right upper limb: Secondary | ICD-10-CM | POA: Diagnosis not present

## 2020-07-28 DIAGNOSIS — E7800 Pure hypercholesterolemia, unspecified: Secondary | ICD-10-CM | POA: Diagnosis not present

## 2020-07-28 DIAGNOSIS — R7303 Prediabetes: Secondary | ICD-10-CM | POA: Diagnosis not present

## 2020-07-28 DIAGNOSIS — F419 Anxiety disorder, unspecified: Secondary | ICD-10-CM | POA: Diagnosis not present

## 2020-07-28 DIAGNOSIS — R609 Edema, unspecified: Secondary | ICD-10-CM | POA: Diagnosis not present

## 2020-07-28 DIAGNOSIS — G43009 Migraine without aura, not intractable, without status migrainosus: Secondary | ICD-10-CM | POA: Diagnosis not present

## 2020-08-03 DIAGNOSIS — M79641 Pain in right hand: Secondary | ICD-10-CM | POA: Diagnosis not present

## 2020-09-21 DIAGNOSIS — Z1212 Encounter for screening for malignant neoplasm of rectum: Secondary | ICD-10-CM | POA: Diagnosis not present

## 2021-01-16 DIAGNOSIS — Z6838 Body mass index (BMI) 38.0-38.9, adult: Secondary | ICD-10-CM | POA: Diagnosis not present

## 2021-01-16 DIAGNOSIS — Z1231 Encounter for screening mammogram for malignant neoplasm of breast: Secondary | ICD-10-CM | POA: Diagnosis not present

## 2021-01-16 DIAGNOSIS — Z01419 Encounter for gynecological examination (general) (routine) without abnormal findings: Secondary | ICD-10-CM | POA: Diagnosis not present

## 2021-02-02 DIAGNOSIS — R7303 Prediabetes: Secondary | ICD-10-CM | POA: Diagnosis not present

## 2021-02-02 DIAGNOSIS — F419 Anxiety disorder, unspecified: Secondary | ICD-10-CM | POA: Diagnosis not present

## 2021-02-02 DIAGNOSIS — K760 Fatty (change of) liver, not elsewhere classified: Secondary | ICD-10-CM | POA: Diagnosis not present

## 2021-02-02 DIAGNOSIS — E7800 Pure hypercholesterolemia, unspecified: Secondary | ICD-10-CM | POA: Diagnosis not present

## 2021-02-02 DIAGNOSIS — R609 Edema, unspecified: Secondary | ICD-10-CM | POA: Diagnosis not present

## 2021-02-21 DIAGNOSIS — Z1211 Encounter for screening for malignant neoplasm of colon: Secondary | ICD-10-CM | POA: Diagnosis not present

## 2021-03-30 DIAGNOSIS — M65311 Trigger thumb, right thumb: Secondary | ICD-10-CM | POA: Diagnosis not present

## 2021-03-30 DIAGNOSIS — M79644 Pain in right finger(s): Secondary | ICD-10-CM | POA: Diagnosis not present

## 2021-03-30 DIAGNOSIS — M13841 Other specified arthritis, right hand: Secondary | ICD-10-CM | POA: Diagnosis not present

## 2021-04-05 DIAGNOSIS — E2839 Other primary ovarian failure: Secondary | ICD-10-CM | POA: Diagnosis not present

## 2021-04-05 DIAGNOSIS — Z13820 Encounter for screening for osteoporosis: Secondary | ICD-10-CM | POA: Diagnosis not present

## 2021-04-05 DIAGNOSIS — Z8739 Personal history of other diseases of the musculoskeletal system and connective tissue: Secondary | ICD-10-CM | POA: Diagnosis not present

## 2021-04-27 DIAGNOSIS — M65311 Trigger thumb, right thumb: Secondary | ICD-10-CM | POA: Diagnosis not present

## 2021-06-19 DIAGNOSIS — D485 Neoplasm of uncertain behavior of skin: Secondary | ICD-10-CM | POA: Diagnosis not present

## 2021-07-10 DIAGNOSIS — M65311 Trigger thumb, right thumb: Secondary | ICD-10-CM | POA: Diagnosis not present

## 2021-07-24 DIAGNOSIS — M25641 Stiffness of right hand, not elsewhere classified: Secondary | ICD-10-CM | POA: Diagnosis not present

## 2021-08-03 DIAGNOSIS — R609 Edema, unspecified: Secondary | ICD-10-CM | POA: Diagnosis not present

## 2021-08-03 DIAGNOSIS — E7800 Pure hypercholesterolemia, unspecified: Secondary | ICD-10-CM | POA: Diagnosis not present

## 2021-08-03 DIAGNOSIS — K760 Fatty (change of) liver, not elsewhere classified: Secondary | ICD-10-CM | POA: Diagnosis not present

## 2021-08-03 DIAGNOSIS — R7303 Prediabetes: Secondary | ICD-10-CM | POA: Diagnosis not present

## 2021-08-03 DIAGNOSIS — F419 Anxiety disorder, unspecified: Secondary | ICD-10-CM | POA: Diagnosis not present

## 2021-12-15 IMAGING — US US ABDOMEN LIMITED
1 series · 14 of 25 positions shown · non-contrast
Comparison: No pertinent prior exams are available for comparison.

CLINICAL DATA: Provided history: Elevated liver function tests.

EXAM:
ULTRASOUND ABDOMEN LIMITED RIGHT UPPER QUADRANT

[Series 1: us abdomen limited · 0.22mm/px · 14 of 44 slices shown]
[im 1/44]
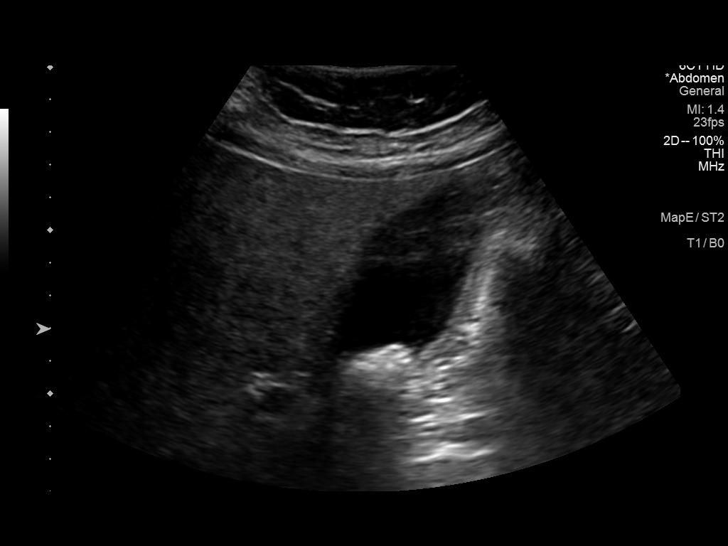
[im 4/44]
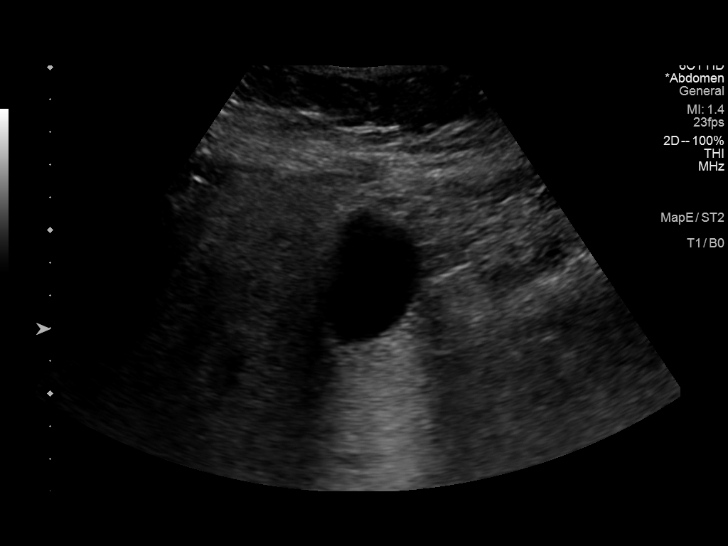
[im 8/44]
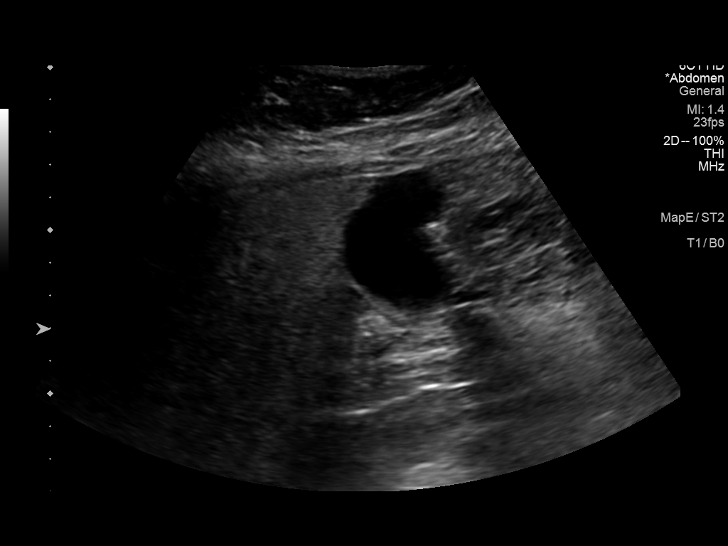
[im 11/44]
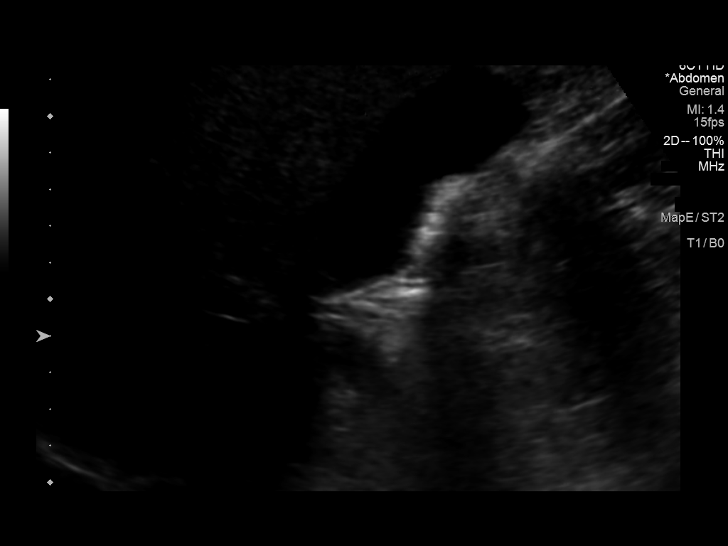
[im 15/44]
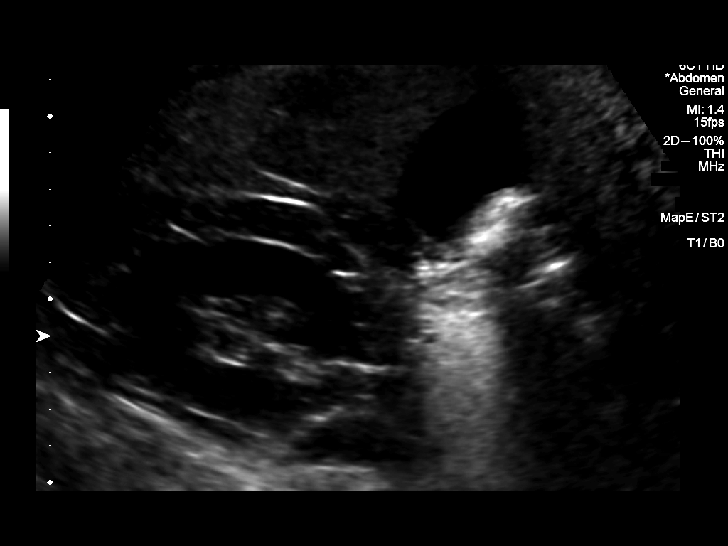
[im 17/44]
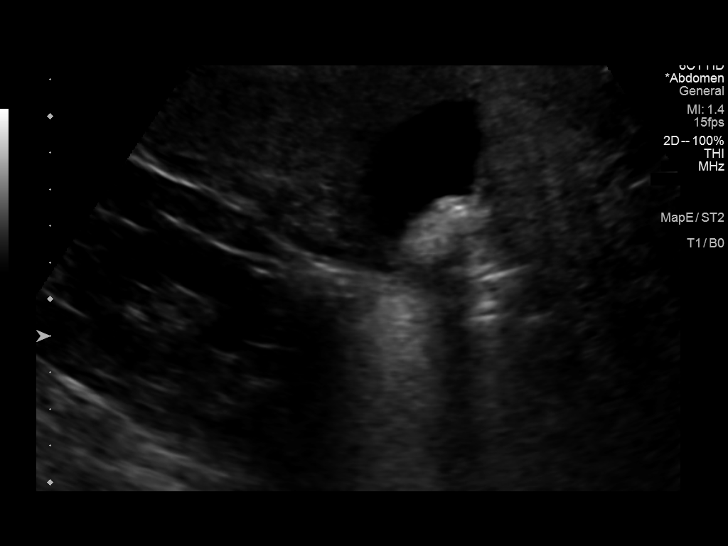
[im 20/44]
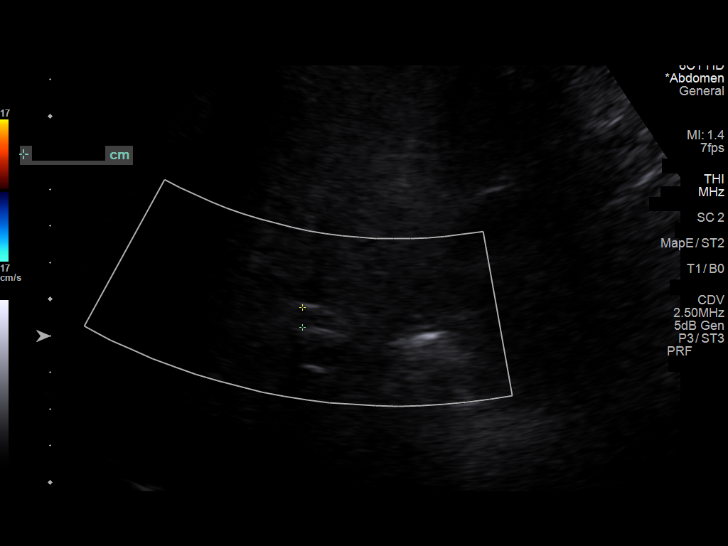
[im 24/44]
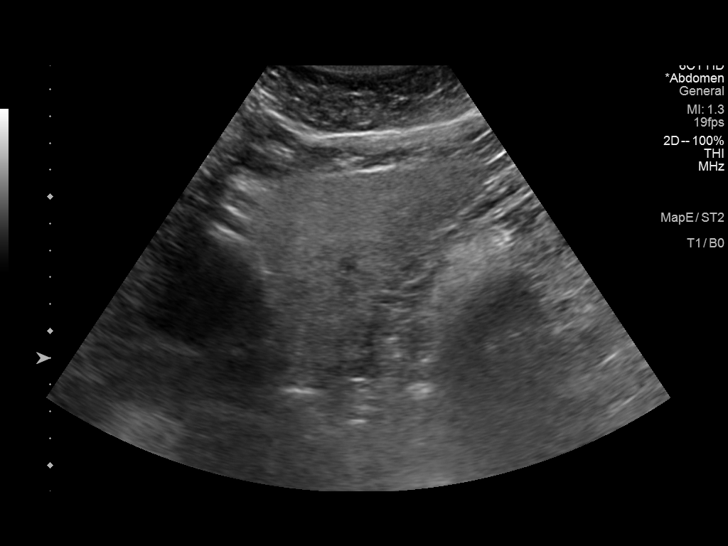
[im 27/44]
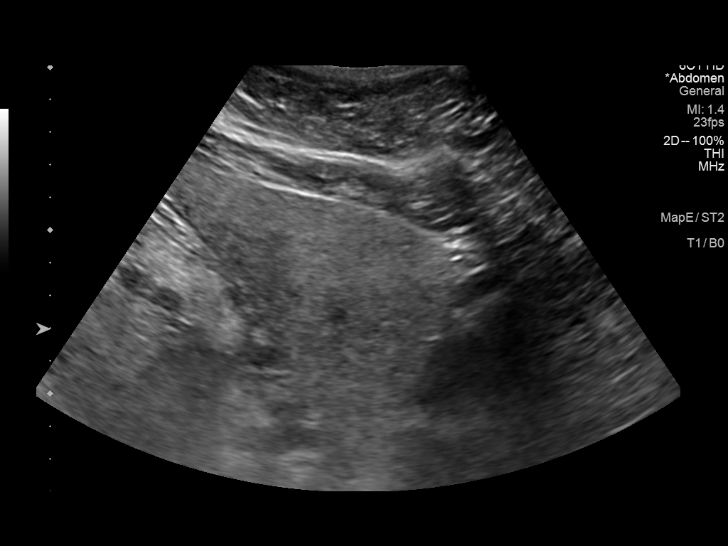
[im 29/44]
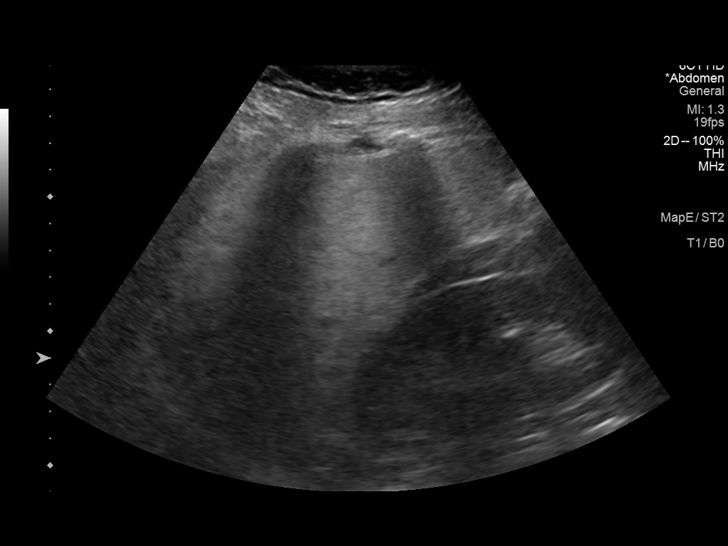
[im 33/44]
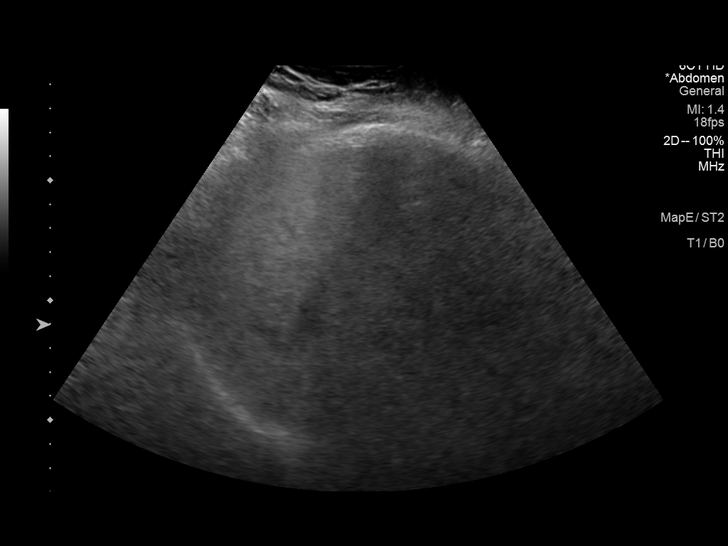
[im 36/44]
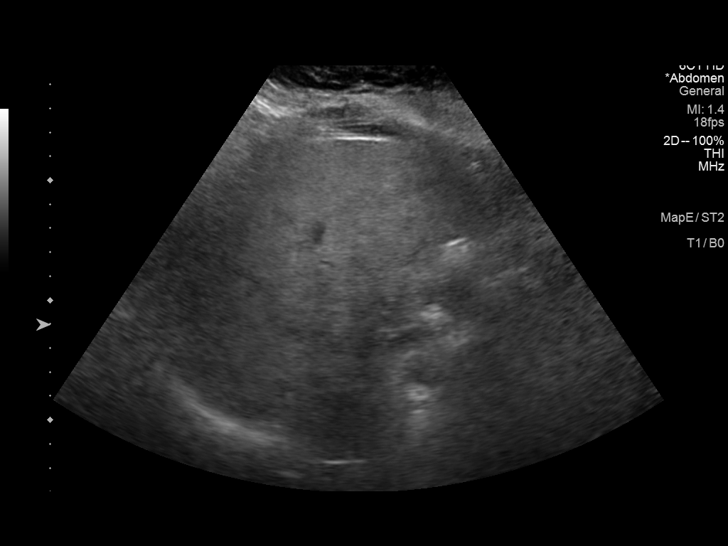
[im 40/44]
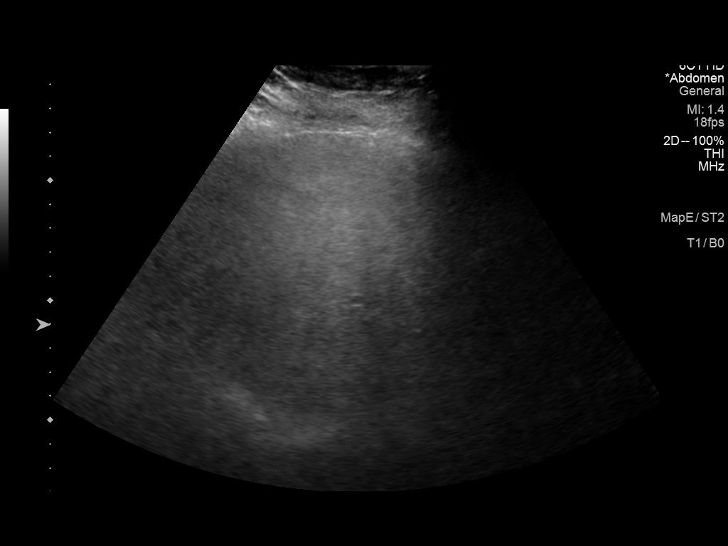
[im 44/44]
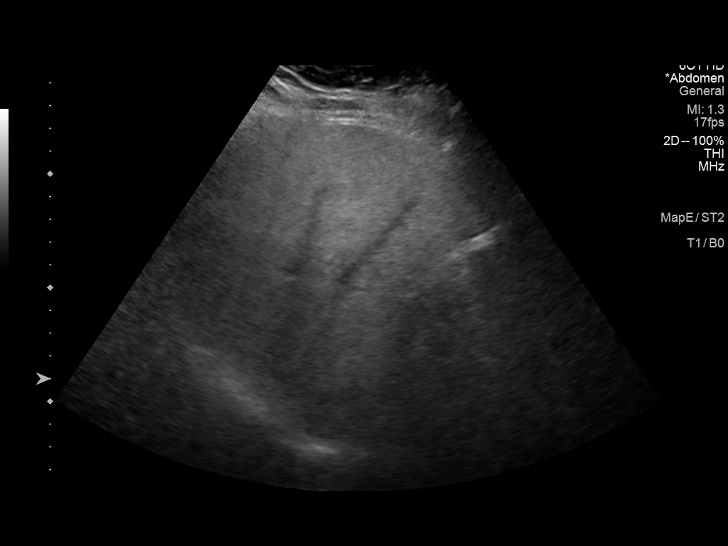

[14 of 25 positions shown; findings below may reference images not displayed]

FINDINGS: Gallbladder:

Cholecystolithiasis. No gallbladder wall thickening is visualized.
No sonographic Murphy sign is elicited by the scanning technologist.

Common bile duct:

Diameter: 5-6 mm.

Liver:

No focal lesion identified. Generalized increased hepatic
parenchymal echogenicity. Portal vein is patent on color Doppler
imaging with normal direction of blood flow towards the liver.
IMPRESSION: Hyperechogenicity of the hepatic parenchyma. This is a nonspecific
finding, which may be seen in the setting of hepatic steatosis or
other chronic hepatic parenchymal disease.

Cholecystolithiasis.
